# Patient Record
Sex: Female | Born: 1950 | Race: White | Hispanic: No | Marital: Married | Smoking: Never smoker
Health system: Southern US, Community
[De-identification: ages and names within clinical notes are randomized; demographics above are authoritative.]

## PROBLEM LIST (undated history)

## (undated) DIAGNOSIS — K219 Gastro-esophageal reflux disease without esophagitis: Secondary | ICD-10-CM

## (undated) DIAGNOSIS — F32A Depression, unspecified: Secondary | ICD-10-CM

## (undated) DIAGNOSIS — F329 Major depressive disorder, single episode, unspecified: Secondary | ICD-10-CM

## (undated) DIAGNOSIS — M1990 Unspecified osteoarthritis, unspecified site: Secondary | ICD-10-CM

## (undated) DIAGNOSIS — I10 Essential (primary) hypertension: Secondary | ICD-10-CM

## (undated) HISTORY — PX: JOINT REPLACEMENT: SHX530

---

## 2018-06-04 ENCOUNTER — Emergency Department (HOSPITAL_COMMUNITY): Payer: Medicare HMO

## 2018-06-04 ENCOUNTER — Encounter (HOSPITAL_COMMUNITY): Payer: Self-pay

## 2018-06-04 ENCOUNTER — Other Ambulatory Visit: Payer: Self-pay

## 2018-06-04 ENCOUNTER — Observation Stay (HOSPITAL_COMMUNITY)
Admission: EM | Admit: 2018-06-04 | Discharge: 2018-06-05 | Disposition: A | Discharge status: Home / Self Care | Payer: Medicare HMO | Attending: Internal Medicine | Admitting: Internal Medicine

## 2018-06-04 DIAGNOSIS — R1084 Generalized abdominal pain: Secondary | ICD-10-CM

## 2018-06-04 DIAGNOSIS — M1990 Unspecified osteoarthritis, unspecified site: Secondary | ICD-10-CM | POA: Insufficient documentation

## 2018-06-04 DIAGNOSIS — R55 Syncope and collapse: Secondary | ICD-10-CM

## 2018-06-04 DIAGNOSIS — K5900 Constipation, unspecified: Secondary | ICD-10-CM | POA: Insufficient documentation

## 2018-06-04 DIAGNOSIS — F329 Major depressive disorder, single episode, unspecified: Secondary | ICD-10-CM | POA: Diagnosis not present

## 2018-06-04 DIAGNOSIS — Z888 Allergy status to other drugs, medicaments and biological substances status: Secondary | ICD-10-CM | POA: Diagnosis not present

## 2018-06-04 DIAGNOSIS — Z79899 Other long term (current) drug therapy: Secondary | ICD-10-CM | POA: Insufficient documentation

## 2018-06-04 DIAGNOSIS — K449 Diaphragmatic hernia without obstruction or gangrene: Secondary | ICD-10-CM | POA: Insufficient documentation

## 2018-06-04 DIAGNOSIS — Z91048 Other nonmedicinal substance allergy status: Secondary | ICD-10-CM | POA: Insufficient documentation

## 2018-06-04 DIAGNOSIS — Z9660 Presence of unspecified orthopedic joint implant: Secondary | ICD-10-CM | POA: Diagnosis not present

## 2018-06-04 DIAGNOSIS — I700 Atherosclerosis of aorta: Secondary | ICD-10-CM | POA: Insufficient documentation

## 2018-06-04 DIAGNOSIS — K529 Noninfective gastroenteritis and colitis, unspecified: Principal | ICD-10-CM | POA: Insufficient documentation

## 2018-06-04 DIAGNOSIS — R160 Hepatomegaly, not elsewhere classified: Secondary | ICD-10-CM | POA: Diagnosis present

## 2018-06-04 DIAGNOSIS — R1110 Vomiting, unspecified: Secondary | ICD-10-CM | POA: Diagnosis present

## 2018-06-04 DIAGNOSIS — N281 Cyst of kidney, acquired: Secondary | ICD-10-CM | POA: Insufficient documentation

## 2018-06-04 DIAGNOSIS — I10 Essential (primary) hypertension: Secondary | ICD-10-CM | POA: Diagnosis present

## 2018-06-04 DIAGNOSIS — A09 Infectious gastroenteritis and colitis, unspecified: Secondary | ICD-10-CM | POA: Diagnosis not present

## 2018-06-04 DIAGNOSIS — K219 Gastro-esophageal reflux disease without esophagitis: Secondary | ICD-10-CM | POA: Insufficient documentation

## 2018-06-04 DIAGNOSIS — I2510 Atherosclerotic heart disease of native coronary artery without angina pectoris: Secondary | ICD-10-CM | POA: Diagnosis not present

## 2018-06-04 DIAGNOSIS — R197 Diarrhea, unspecified: Secondary | ICD-10-CM | POA: Diagnosis present

## 2018-06-04 DIAGNOSIS — Z881 Allergy status to other antibiotic agents status: Secondary | ICD-10-CM | POA: Diagnosis not present

## 2018-06-04 HISTORY — DX: Unspecified osteoarthritis, unspecified site: M19.90

## 2018-06-04 HISTORY — DX: Essential (primary) hypertension: I10

## 2018-06-04 HISTORY — DX: Gastro-esophageal reflux disease without esophagitis: K21.9

## 2018-06-04 HISTORY — DX: Major depressive disorder, single episode, unspecified: F32.9

## 2018-06-04 HISTORY — DX: Depression, unspecified: F32.A

## 2018-06-04 LAB — CBC WITH DIFFERENTIAL/PLATELET
Abs Immature Granulocytes: 0.02 10*3/uL (ref 0.00–0.07)
BASOS ABS: 0 10*3/uL (ref 0.0–0.1)
BASOS PCT: 0 %
EOS ABS: 0.2 10*3/uL (ref 0.0–0.5)
Eosinophils Relative: 2 %
HEMATOCRIT: 40.4 % (ref 36.0–46.0)
Hemoglobin: 12.8 g/dL (ref 12.0–15.0)
IMMATURE GRANULOCYTES: 0 %
Lymphocytes Relative: 17 %
Lymphs Abs: 1.1 10*3/uL (ref 0.7–4.0)
MCH: 26.9 pg (ref 26.0–34.0)
MCHC: 31.7 g/dL (ref 30.0–36.0)
MCV: 85.1 fL (ref 80.0–100.0)
Monocytes Absolute: 0.1 10*3/uL (ref 0.1–1.0)
Monocytes Relative: 1 %
NEUTROS PCT: 80 %
NRBC: 0 % (ref 0.0–0.2)
Neutro Abs: 5.3 10*3/uL (ref 1.7–7.7)
Platelets: 242 10*3/uL (ref 150–400)
RBC: 4.75 MIL/uL (ref 3.87–5.11)
RDW: 13.7 % (ref 11.5–15.5)
WBC: 6.7 10*3/uL (ref 4.0–10.5)

## 2018-06-04 LAB — URINALYSIS, ROUTINE W REFLEX MICROSCOPIC
BACTERIA UA: NONE SEEN
Bilirubin Urine: NEGATIVE
Glucose, UA: NEGATIVE mg/dL
Ketones, ur: NEGATIVE mg/dL
Leukocytes, UA: NEGATIVE
Nitrite: NEGATIVE
PROTEIN: NEGATIVE mg/dL
Specific Gravity, Urine: 1.016 (ref 1.005–1.030)
pH: 7 (ref 5.0–8.0)

## 2018-06-04 LAB — COMPREHENSIVE METABOLIC PANEL
ALBUMIN: 4.3 g/dL (ref 3.5–5.0)
ALT: 29 U/L (ref 0–44)
AST: 31 U/L (ref 15–41)
Alkaline Phosphatase: 77 U/L (ref 38–126)
Anion gap: 10 (ref 5–15)
BILIRUBIN TOTAL: 0.7 mg/dL (ref 0.3–1.2)
BUN: 12 mg/dL (ref 8–23)
CO2: 23 mmol/L (ref 22–32)
Calcium: 9.5 mg/dL (ref 8.9–10.3)
Chloride: 105 mmol/L (ref 98–111)
Creatinine, Ser: 1.01 mg/dL — ABNORMAL HIGH (ref 0.44–1.00)
GFR calc Af Amer: >60 mL/min (ref >60)
GFR calc non Af Amer: 56 mL/min — ABNORMAL LOW (ref >60)
GLUCOSE: 111 mg/dL — AB (ref 70–99)
POTASSIUM: 3.6 mmol/L (ref 3.5–5.1)
SODIUM: 138 mmol/L (ref 135–145)
Total Protein: 7.9 g/dL (ref 6.5–8.1)

## 2018-06-04 LAB — LIPASE, BLOOD: Lipase: 73 U/L — ABNORMAL HIGH (ref 11–51)

## 2018-06-04 LAB — MRSA PCR SCREENING: MRSA BY PCR: NEGATIVE

## 2018-06-04 LAB — TROPONIN I

## 2018-06-04 MED ORDER — CIPROFLOXACIN HCL 500 MG PO TABS: 500.0000 mg | ORAL_TABLET | Freq: Two times a day (BID) | ORAL | 0 refills | Status: DC

## 2018-06-04 MED ORDER — PANTOPRAZOLE SODIUM 40 MG PO TBEC
40.0000 mg | DELAYED_RELEASE_TABLET | Freq: Every day | ORAL | Status: DC
Start: 2018-06-04 — End: 2018-06-05
  Administered 2018-06-04 – 2018-06-05 (×2): 40 mg via ORAL
  Filled 2018-06-04 (×2): qty 1

## 2018-06-04 MED ORDER — ONDANSETRON 8 MG PO TBDP: 8.0000 mg | ORAL_TABLET | Freq: Three times a day (TID) | ORAL | 0 refills | Status: DC | PRN

## 2018-06-04 MED ORDER — CIPROFLOXACIN IN D5W 400 MG/200ML IV SOLN
400.0000 mg | Freq: Two times a day (BID) | INTRAVENOUS | Status: DC
  Administered 2018-06-04 – 2018-06-05 (×3): 400 mg via INTRAVENOUS
  Filled 2018-06-04 (×3): qty 200

## 2018-06-04 MED ORDER — LOSARTAN POTASSIUM 50 MG PO TABS
100.0000 mg | ORAL_TABLET | Freq: Every day | ORAL | Status: DC
  Administered 2018-06-04 – 2018-06-05 (×2): 100 mg via ORAL
  Filled 2018-06-04 (×2): qty 2

## 2018-06-04 MED ORDER — HYOSCYAMINE SULFATE 0.125 MG PO TABS
0.2500 mg | ORAL_TABLET | Freq: Once | ORAL | Status: AC
  Administered 2018-06-04: 0.25 mg via ORAL
  Filled 2018-06-04: qty 2

## 2018-06-04 MED ORDER — ONDANSETRON HCL 4 MG PO TABS
4.0000 mg | ORAL_TABLET | Freq: Four times a day (QID) | ORAL | Status: DC | PRN
  Administered 2018-06-05: 4 mg via ORAL
  Filled 2018-06-04: qty 1

## 2018-06-04 MED ORDER — SIMETHICONE 80 MG PO CHEW
80.0000 mg | CHEWABLE_TABLET | Freq: Once | ORAL | Status: AC
  Administered 2018-06-04: 80 mg via ORAL
  Filled 2018-06-04: qty 1

## 2018-06-04 MED ORDER — ACETAMINOPHEN 650 MG RE SUPP: 650.0000 mg | Freq: Four times a day (QID) | RECTAL | Status: DC | PRN

## 2018-06-04 MED ORDER — IOPAMIDOL (ISOVUE-300) INJECTION 61%
100.0000 mL | Freq: Once | INTRAVENOUS | Status: AC | PRN
  Administered 2018-06-04: 100 mL via INTRAVENOUS

## 2018-06-04 MED ORDER — ONDANSETRON HCL 4 MG/2ML IJ SOLN: 4.0000 mg | Freq: Four times a day (QID) | INTRAMUSCULAR | Status: DC | PRN

## 2018-06-04 MED ORDER — ONDANSETRON HCL 4 MG/2ML IJ SOLN
INTRAMUSCULAR | Status: AC
  Filled 2018-06-04: qty 2

## 2018-06-04 MED ORDER — ONDANSETRON HCL 4 MG/2ML IJ SOLN
4.0000 mg | Freq: Once | INTRAMUSCULAR | Status: AC
  Administered 2018-06-04: 4 mg via INTRAVENOUS

## 2018-06-04 MED ORDER — METRONIDAZOLE IN NACL 5-0.79 MG/ML-% IV SOLN
500.0000 mg | Freq: Three times a day (TID) | INTRAVENOUS | Status: DC
  Administered 2018-06-04 – 2018-06-05 (×3): 500 mg via INTRAVENOUS
  Filled 2018-06-04 (×3): qty 100

## 2018-06-04 MED ORDER — SODIUM CHLORIDE 0.9 % IV BOLUS
500.0000 mL | Freq: Once | INTRAVENOUS | Status: AC
  Administered 2018-06-04: 500 mL via INTRAVENOUS

## 2018-06-04 MED ORDER — ONDANSETRON HCL 4 MG/2ML IJ SOLN
4.0000 mg | Freq: Once | INTRAMUSCULAR | Status: AC
  Administered 2018-06-04: 4 mg via INTRAVENOUS
  Filled 2018-06-04: qty 2

## 2018-06-04 MED ORDER — MORPHINE SULFATE (PF) 4 MG/ML IV SOLN
4.0000 mg | Freq: Once | INTRAVENOUS | Status: AC
  Administered 2018-06-04: 4 mg via INTRAVENOUS
  Filled 2018-06-04: qty 1

## 2018-06-04 MED ORDER — CITALOPRAM HYDROBROMIDE 20 MG PO TABS
40.0000 mg | ORAL_TABLET | Freq: Every day | ORAL | Status: DC
Start: 2018-06-04 — End: 2018-06-05
  Administered 2018-06-04 – 2018-06-05 (×2): 40 mg via ORAL
  Filled 2018-06-04 (×2): qty 2

## 2018-06-04 MED ORDER — POTASSIUM CHLORIDE 2 MEQ/ML IV SOLN
INTRAVENOUS | Status: DC
  Administered 2018-06-04 – 2018-06-05 (×2): via INTRAVENOUS
  Filled 2018-06-04 (×7): qty 1000

## 2018-06-04 MED ORDER — METRONIDAZOLE 500 MG PO TABS: 500.0000 mg | ORAL_TABLET | Freq: Three times a day (TID) | ORAL | 0 refills | Status: DC

## 2018-06-04 MED ORDER — METRONIDAZOLE 500 MG PO TABS
500.0000 mg | ORAL_TABLET | Freq: Once | ORAL | Status: AC
  Administered 2018-06-04: 500 mg via ORAL
  Filled 2018-06-04: qty 1

## 2018-06-04 MED ORDER — ACETAMINOPHEN 325 MG PO TABS: 650.0000 mg | ORAL_TABLET | Freq: Four times a day (QID) | ORAL | Status: DC | PRN

## 2018-06-04 NOTE — Progress Notes (Signed)
Pt refusing second IV access for IV abx to be given. This RN called and spoke with [facility] to see if Cipro and Flagyl is compatible with LR with 20 of Potassium. [facility] stated they are compatible.

## 2018-06-04 NOTE — H&P (Addendum)
History and Physical    Charlene Jensen ONG:[id number] DOB: [birth year] DOA: 06/04/2018  PCP: System, Pcp Not In Patient is visiting from [place]  Patient coming from: home  I have personally briefly reviewed patient's old medical records in [facility]  Chief Complaint: vomiting, diarrhea  HPI: Charlene Jensen is a 67 y.o. female with medical history significant of HTN, GERD who is visiting her sister from [place]. She reports that she felt constipated for the last several days. She had been having small bowel movements, but felt bloated. She had taken two laxative pills and then began having the urge to move her bowels. She sat on the toilet and began straining. She began to feel lightheaded and felt as though she was going to pass out, although she denies loss of consciousness. She felt unwell, so paramedics were called. When they arrived, she started moving her bowels and reports having several bowel movements. She also started vomiting yellow colored vomitus. She denies any fever, cough or shortness of breath. She has noticed a small amount of blood in her stools. She denies any recent antibiotics. No sick contacts. She has not been eating out.  ED Course: in ED, labs were noted to be unremarkable. Heart rate noted in the 50-60s. EKG did not show any acute changes. CT abdomen did show some colitis. Since she had persistent vomiting, she has been referred for admission.  Review of Systems: As per HPI otherwise 10 point review of systems negative.    Past Medical History:  Diagnosis Date  . Acid reflux   . Arthritis   . Depression   . Hypertension     Past Surgical History:  Procedure Laterality Date  . JOINT REPLACEMENT      Social History:  reports that she has never smoked. She has never used smokeless tobacco. Her alcohol and drug histories are not on file.  Allergies  Allergen Reactions  . Adhesive [Tape]   . Zithromax [Azithromycin] Hives    Family  history: Family history reviewed and not pertinent  Prior to Admission medications   Medication Sig Start Date End Date Taking? Authorizing Provider  citalopram (CELEXA) 40 MG tablet Take 40 mg by mouth daily.  05/06/18  Yes [provider]  losartan (COZAAR) 100 MG tablet Take 100 mg by mouth daily.   Yes [provider]  pantoprazole (PROTONIX) 40 MG tablet Take 40 mg by mouth daily.  05/06/18  Yes [provider]  ciprofloxacin (CIPRO) 500 MG tablet Take 1 tablet (500 mg total) by mouth 2 (two) times daily. 06/04/18   Linwood Dibbles, MD  metroNIDAZOLE (FLAGYL) 500 MG tablet Take 1 tablet (500 mg total) by mouth 3 (three) times daily for 10 days. 06/04/18 06/14/18  Linwood Dibbles, MD  ondansetron (ZOFRAN ODT) 8 MG disintegrating tablet Take 1 tablet (8 mg total) by mouth every 8 (eight) hours as needed for nausea or vomiting. 06/04/18   Linwood Dibbles, MD    Physical Exam: Vitals:   06/04/18 1357 06/04/18 1415 06/04/18 1430 06/04/18 1456  BP:    (!) 168/82  Pulse: (!) 59 62 (!) 58 (!) 56  Resp: 19 16 18 14   Temp:      TempSrc:      SpO2: 92% 93% 93% 92%  Weight:      Height:        Constitutional: NAD, calm, comfortable Eyes: PERRL, lids and conjunctivae normal ENMT: Mucous membranes are moist. Posterior pharynx clear of any exudate or lesions.Normal dentition.  Neck: normal, supple, no masses, no thyromegaly Respiratory: clear to auscultation bilaterally, no wheezing, no crackles. Normal respiratory effort. No accessory muscle use.  Cardiovascular: Regular rate and rhythm, no murmurs / rubs / gallops. No extremity edema. 2+ pedal pulses. No carotid bruits.  Abdomen: no tenderness, no masses palpated. No hepatosplenomegaly. Bowel sounds positive.  Musculoskeletal: no clubbing / cyanosis. No joint deformity upper and lower extremities. Good ROM, no contractures. Normal muscle tone.  Skin: no rashes, lesions, ulcers. No induration Neurologic: CN 2-12 grossly intact.  Sensation intact, DTR normal. Strength 5/5 in all 4.  Psychiatric: Normal judgment and insight. Alert and oriented x 3. Normal mood.    Labs on Admission: I have personally reviewed following labs and imaging studies  CBC: Recent Labs  Lab 06/04/18 0623  WBC 6.7  NEUTROABS 5.3  HGB 12.8  HCT 40.4  MCV 85.1  PLT 242   Basic Metabolic Panel: Recent Labs  Lab 06/04/18 0623  NA 138  K 3.6  CL 105  CO2 23  GLUCOSE 111*  BUN 12  CREATININE 1.01*  CALCIUM 9.5   GFR: Estimated Creatinine Clearance: 47.4 mL/min (A) (by C-G formula based on SCr of 1.01 mg/dL (H)). Liver Function Tests: Recent Labs  Lab 06/04/18 0623  AST 31  ALT 29  ALKPHOS 77  BILITOT 0.7  PROT 7.9  ALBUMIN 4.3   Recent Labs  Lab 06/04/18 0623  LIPASE 73*   No results for input(s): AMMONIA in the last 168 hours. Coagulation Profile: No results for input(s): INR, PROTIME in the last 168 hours. Cardiac Enzymes: Recent Labs  Lab 06/04/18 0623  TROPONINI <0.03   BNP (last 3 results) No results for input(s): PROBNP in the last 8760 hours. HbA1C: No results for input(s): HGBA1C in the last 72 hours. CBG: No results for input(s): GLUCAP in the last 168 hours. Lipid Profile: No results for input(s): CHOL, HDL, LDLCALC, TRIG, CHOLHDL, LDLDIRECT in the last 72 hours. Thyroid Function Tests: No results for input(s): TSH, T4TOTAL, FREET4, T3FREE, THYROIDAB in the last 72 hours. Anemia Panel: No results for input(s): VITAMINB12, FOLATE, FERRITIN, TIBC, IRON, RETICCTPCT in the last 72 hours. Urine analysis:    Component Value Date/Time   COLORURINE STRAW (A) 06/04/2018 0550   APPEARANCEUR CLEAR 06/04/2018 0550   LABSPEC 1.016 06/04/2018 0550   PHURINE 7.0 06/04/2018 0550   GLUCOSEU NEGATIVE 06/04/2018 0550   HGBUR SMALL (A) 06/04/2018 0550   BILIRUBINUR NEGATIVE 06/04/2018 0550   KETONESUR NEGATIVE 06/04/2018 0550   PROTEINUR NEGATIVE 06/04/2018 0550   NITRITE NEGATIVE 06/04/2018 0550    LEUKOCYTESUR NEGATIVE 06/04/2018 0550    Radiological Exams on Admission: Ct Abdomen Pelvis W Contrast  Result Date: 06/04/2018 CLINICAL DATA:  Constipation. Abdominal distension. Umbilical and bilateral lower abdominal pain. EXAM: CT ABDOMEN AND PELVIS WITH CONTRAST TECHNIQUE: Multidetector CT imaging of the abdomen and pelvis was performed using the standard protocol following bolus administration of intravenous contrast. CONTRAST:  ISOVUE-300 IOPAMIDOL (ISOVUE-300) INJECTION 61% COMPARISON:  Abdominal radiographs from earlier today. FINDINGS: Lower chest: No significant pulmonary nodules or acute consolidative airspace disease. Coronary atherosclerosis. Hepatobiliary: Normal liver size. There is a peripheral segment 8 right liver lobe 3.7 cm mass (series 2/image 22) with progressive discontinuous peripheral nodular enhancement, compatible with a hemangioma. There is a similar 1.2 cm posterior right liver lobe mass (series 2/image 27) with progressive nodular enhancement compatible with a hemangioma. There is a segment 8 right liver dome hypodense 3.7 x 2.1 cm mass (series 2/image 12).  No additional liver lesions. Normal gallbladder with no radiopaque cholelithiasis. No biliary ductal dilatation. Pancreas: Normal, with no mass or duct dilation. Spleen: Normal size. No mass. Adrenals/Urinary Tract: Normal adrenals. Simple small renal cysts in the upper kidneys bilaterally measuring 1.2 cm on the right and 1.2 cm on the left. Subcentimeter hypodense renal cortical lesion in the posterior interpolar left kidney, too small to characterize, which requires no follow-up. No hydronephrosis. Normal bladder. Stomach/Bowel: Small hiatal hernia. Otherwise normal nondistended stomach. Normal caliber small bowel with no small bowel wall thickening. Normal appendix. Fluid levels in right colon. Borderline mild wall thickening and mucosal hyperenhancement throughout the right and transverse colon with trace  pericolonic fat stranding. No significant colonic diverticulosis. Vascular/Lymphatic: Atherosclerotic nonaneurysmal abdominal aorta. Patent portal, splenic, hepatic and renal veins. No pathologically enlarged lymph nodes in the abdomen or pelvis. Reproductive: Status post hysterectomy, with no abnormal findings at the vaginal cuff. No adnexal mass. Other: No pneumoperitoneum, ascites or focal fluid collection. Musculoskeletal: No aggressive appearing focal osseous lesions. Mild lower thoracic spondylosis. IMPRESSION: 1. CT findings are suggestive of a mild nonspecific infectious or inflammatory proximal colitis. No bowel obstruction. No free air. No abscess. Normal appendix. 2. Indeterminate hypodense 3.7 cm right liver dome mass. Outpatient MRI abdomen without with IV contrast recommended for further characterization. 3. Small hiatal hernia. 4.  Aortic Atherosclerosis (ICD10-I70.0). Electronically Signed   By: Delbert Phenix M.D.   On: 06/04/2018 08:15   Dg Abd Acute W/chest  Result Date: 06/04/2018 CLINICAL DATA:  Constipation. Syncope during attempted bowel movement. Abdominal pain. EXAM: DG ABDOMEN ACUTE W/ 1V CHEST COMPARISON:  None. FINDINGS: Normal heart size and pulmonary vascularity. No focal airspace disease or consolidation in the lungs. No blunting of costophrenic angles. No pneumothorax. Mediastinal contours appear intact. Scattered gas and stool in the colon. No small or large bowel distention. No free intra-abdominal air. No abnormal air-fluid levels. No radiopaque stones. Visualized bones appear intact. IMPRESSION: No evidence of active pulmonary disease. Normal nonobstructive bowel gas pattern. Electronically Signed   By: Burman Nieves M.D.   On: 06/04/2018 06:22    EKG: Independently reviewed. Sinus rhythm without acute changes  Assessment/Plan Principal Problem:   Colitis Active Problems:   Vomiting   Diarrhea   HTN (hypertension)   GERD (gastroesophageal reflux disease)      1. Acute colitis. Presumed infectious. Will treat supportively. Use antiemetics as needed. Continue on cipro/flagyl. Clear liquid diet and advance as tolerated. 2. Vomiting. Continue anti emetics 3. HTN. Continue on losartan. Blood pressure is elevated 4. GERD. Continue on PPI 5. Diarrhea. Continue to monitor frequency. If this continues with more than 4x/day, will consider stool studies. She did have some specks of blood in stools, suspect this is related to straining from constipation. Continue to monitor 6. Liver mass. Incidental finding noted on CT abdomen. Needs outpatient MRI abdomen with contrast  DVT prophylaxis: SCDs  Code Status: full code  Family Communication: no family present  Disposition Plan: discharge home once improved  Consults called:   Admission status: observation, medsurg   Erick Blinks MD [facility] Pager [phone]  If 7PM-7AM, please contact night-coverage [email or web address] Password [email or web address]  06/04/2018, 3:27 PM

## 2018-06-04 NOTE — Discharge Instructions (Signed)
Take the antibiotics as prescribed, follow-up with your doctor later this week to make sure you are improving, return to emergency room if you start having fever or worsening symptoms

## 2018-06-04 NOTE — ED Notes (Signed)
ED Provider at bedside. 

## 2018-06-04 NOTE — ED Notes (Signed)
Stool Occult negative

## 2018-06-04 NOTE — ED Triage Notes (Signed)
Pt reports that she has been constipated, had taken some medication for the constipation, was attempting to have a bowel movement this am and had a syncopal episode, upon pt arrival to er, pt alert, able to answer questions, c/o abd pain,

## 2018-06-04 NOTE — Progress Notes (Signed)
When pt up to [facility] to urinate- RN noticed bright red blood in toilet what looked like a clot. Pt stated she had passed gas, but had no BM.  Will continue to monitor

## 2018-06-04 NOTE — ED Provider Notes (Addendum)
I reviewed the findings with the patient.  CT scan suggests mild colitis.  Likely infectious in nature.  Incidental liver mass also noted.  Patient states she is aware of this diagnosis and has a known hemangioma.  Will dc home with abx   Linwood Dibbles, MD 06/04/18 0935  Pt started vomiting after treatment.  She does not feel well enough to go home.  Will consult with medical service.   Linwood Dibbles, MD 06/04/18 [phone]

## 2018-06-04 NOTE — ED Notes (Signed)
Ems gave zofran en route

## 2018-06-04 NOTE — ED Notes (Signed)
Unable to get accurate blood pressure due to pt trembling

## 2018-06-04 NOTE — ED Notes (Addendum)
Adela Lank (sister [phone] or [phone]) is allowed to be updated on pts medial condition and any other questions needed.

## 2018-06-04 NOTE — ED Provider Notes (Signed)
[facility] EMERGENCY DEPARTMENT Provider Note   CSN: [id number] Arrival date & time: 06/04/18  0543     History   Chief Complaint Chief Complaint  Patient presents with  . Near Syncope  . Constipation    HPI Charlene Jensen is a 67 y.o. female.  Patient presents to the ER for evaluation of near syncope.  Patient reports that she recently was on vacation, changed her eating habits and has been constipated.  She has not had a normal bowel movement in several days.  Her sister gave her some Dulcolax tablets.  She took multiple tablets but initially did not have any response.  She woke up this morning and felt severe abdominal cramping, thought she needed to have a bowel movement.  She sat on the toilet and strained to have a bowel movement at which time she became weak, dizzy and felt like she nearly passed out.  She did not fall or injure herself.  EMS arrived at the scene and found her to be bradycardic and hypotensive, this has improved.  She is actually now hypertensive.  Patient still reports that she is shaky.     Past Medical History:  Diagnosis Date  . Acid reflux   . Arthritis   . Depression   . Hypertension     There are no active problems to display for this patient.   Past Surgical History:  Procedure Laterality Date  . JOINT REPLACEMENT       OB History   None      Home Medications    Prior to Admission medications   Medication Sig Start Date End Date Taking? Authorizing Provider  losartan (COZAAR) 100 MG tablet Take 100 mg by mouth daily.   Yes [provider]  pantoprazole (PROTONIX) 20 MG tablet Take 20 mg by mouth daily.   Yes [provider]    Family History History reviewed. No pertinent family history.  Social History Social History   Tobacco Use  . Smoking status: Never Smoker  . Smokeless tobacco: Never Used  Substance Use Topics  . Alcohol use: Not on file  . Drug use: Not on file     Allergies   Adhesive  [tape] and Zithromax [azithromycin]   Review of Systems Review of Systems  Gastrointestinal: Positive for constipation.  Neurological: Positive for dizziness.  All other systems reviewed and are negative.    Physical Exam Updated Vital Signs BP (!) 132/110   Pulse 80   Temp 98 F (36.7 C) (Oral)   Resp (!) 23   Ht 4\' 10"  (1.473 m)   Wt 79.4 kg   SpO2 95%   BMI 36.58 kg/m   Physical Exam  Constitutional: She is oriented to person, place, and time. She appears well-developed and well-nourished. No distress.  HENT:  Head: Normocephalic and atraumatic.  Right Ear: Hearing normal.  Left Ear: Hearing normal.  Nose: Nose normal.  Mouth/Throat: Oropharynx is clear and moist and mucous membranes are normal.  Eyes: Pupils are equal, round, and reactive to light. Conjunctivae and EOM are normal.  Neck: Normal range of motion. Neck supple.  Cardiovascular: Regular rhythm, S1 normal and S2 normal. Exam reveals no gallop and no friction rub.  No murmur heard. Pulmonary/Chest: Effort normal and breath sounds normal. No respiratory distress. She exhibits no tenderness.  Abdominal: Soft. Normal appearance and bowel sounds are normal. There is no hepatosplenomegaly. There is no tenderness. There is no rebound, no guarding, no tenderness at McBurney's point and  negative Murphy's sign. No hernia.  Musculoskeletal: Normal range of motion.  Neurological: She is alert and oriented to person, place, and time. She has normal strength. No cranial nerve deficit or sensory deficit. Coordination normal. GCS eye subscore is 4. GCS verbal subscore is 5. GCS motor subscore is 6.  Skin: Skin is warm, dry and intact. No rash noted. No cyanosis.  Psychiatric: She has a normal mood and affect. Her speech is normal and behavior is normal. Thought content normal.  Nursing note and vitals reviewed.    ED Treatments / Results  Labs (all labs ordered are listed, but only abnormal results are displayed) Labs  Reviewed  COMPREHENSIVE METABOLIC PANEL - Abnormal; Notable for the following components:      Result Value   Glucose, Bld 111 (*)    Creatinine, Ser 1.01 (*)    GFR calc non Af Amer 56 (*)    All other components within normal limits  LIPASE, BLOOD - Abnormal; Notable for the following components:   Lipase 73 (*)    All other components within normal limits  CBC WITH DIFFERENTIAL/PLATELET  TROPONIN I  URINALYSIS, ROUTINE W REFLEX MICROSCOPIC    EKG EKG Interpretation  Date/Time:  Monday June 04 2018 05:53:51 EST Ventricular Rate:  60 PR Interval:    QRS Duration: 95 QT Interval:  456 QTC Calculation: 456 R Axis:   4 Text Interpretation:  Sinus rhythm Normal ECG Confirmed by Gilda Crease ([id number]) on 06/04/2018 5:57:33 AM   Radiology Dg Abd Acute W/chest  Result Date: 06/04/2018 CLINICAL DATA:  Constipation. Syncope during attempted bowel movement. Abdominal pain. EXAM: DG ABDOMEN ACUTE W/ 1V CHEST COMPARISON:  None. FINDINGS: Normal heart size and pulmonary vascularity. No focal airspace disease or consolidation in the lungs. No blunting of costophrenic angles. No pneumothorax. Mediastinal contours appear intact. Scattered gas and stool in the colon. No small or large bowel distention. No free intra-abdominal air. No abnormal air-fluid levels. No radiopaque stones. Visualized bones appear intact. IMPRESSION: No evidence of active pulmonary disease. Normal nonobstructive bowel gas pattern. Electronically Signed   By: Burman Nieves M.D.   On: 06/04/2018 06:22    Procedures Procedures (including critical care time)  Medications Ordered in ED Medications  hyoscyamine (LEVSIN, ANASPAZ) tablet 0.25 mg (has no administration in time range)  sodium chloride 0.9 % bolus 500 mL (has no administration in time range)  morphine 4 MG/ML injection 4 mg (has no administration in time range)  ondansetron (ZOFRAN) injection 4 mg (has no administration in time range)      Initial Impression / Assessment and Plan / ED Course  I have reviewed the triage vital signs and the nursing notes.  Pertinent labs & imaging results that were available during my care of the patient were reviewed by me and considered in my medical decision making (see chart for details).     She presents to the emergency department after near syncopal episode that occurred while bearing down on the toilet to have a bowel movement.  Patient reports that she has been constipated for the last several days.  She took multiple laxatives but did not have any bowel movement.  This morning she started having abdominal pain and cramping, was bearing down on the toilet when she nearly passed out.  This is likely a vasovagal response secondary to the Valsalva maneuver and abdominal pain.   Abdominal exam reveals diffuse tenderness, no signs of peritonitis.  White blood cell count is normal.  Patient  is afebrile with normal vital signs currently.  Lipase is mildly elevated at 73, nonspecific finding.  LFTs are normal, remainder of blood work is unremarkable.  X-ray shows no significant stool, nonspecific bowel gas pattern.  Will perform CT scan to further evaluate.  Will sign out to oncoming ER physician to follow-up CT.  Final Clinical Impressions(s) / ED Diagnoses   Final diagnoses:  Near syncope  Generalized abdominal pain    ED Discharge Orders    None       Gilda Crease, MD 06/04/18 [phone]

## 2018-06-04 NOTE — Progress Notes (Signed)
Pt c/o gas pain. New order for Mylicon per Dr. Rana Snare. Given, will continue to monitor pt

## 2018-06-05 DIAGNOSIS — K529 Noninfective gastroenteritis and colitis, unspecified: Secondary | ICD-10-CM | POA: Diagnosis not present

## 2018-06-05 DIAGNOSIS — I10 Essential (primary) hypertension: Secondary | ICD-10-CM | POA: Diagnosis not present

## 2018-06-05 DIAGNOSIS — A09 Infectious gastroenteritis and colitis, unspecified: Secondary | ICD-10-CM | POA: Diagnosis not present

## 2018-06-05 DIAGNOSIS — R1084 Generalized abdominal pain: Secondary | ICD-10-CM | POA: Diagnosis not present

## 2018-06-05 LAB — CBC
HEMATOCRIT: 34.7 % — AB (ref 36.0–46.0)
Hemoglobin: 11.1 g/dL — ABNORMAL LOW (ref 12.0–15.0)
MCH: 27.3 pg (ref 26.0–34.0)
MCHC: 32 g/dL (ref 30.0–36.0)
MCV: 85.3 fL (ref 80.0–100.0)
NRBC: 0 % (ref 0.0–0.2)
PLATELETS: 241 10*3/uL (ref 150–400)
RBC: 4.07 MIL/uL (ref 3.87–5.11)
RDW: 14 % (ref 11.5–15.5)
WBC: 6.8 10*3/uL (ref 4.0–10.5)

## 2018-06-05 LAB — BASIC METABOLIC PANEL
Anion gap: 9 (ref 5–15)
BUN: 7 mg/dL — ABNORMAL LOW (ref 8–23)
CHLORIDE: 106 mmol/L (ref 98–111)
CO2: 24 mmol/L (ref 22–32)
CREATININE: 0.86 mg/dL (ref 0.44–1.00)
Calcium: 8.9 mg/dL (ref 8.9–10.3)
Glucose, Bld: 112 mg/dL — ABNORMAL HIGH (ref 70–99)
Potassium: 3.8 mmol/L (ref 3.5–5.1)
SODIUM: 139 mmol/L (ref 135–145)

## 2018-06-05 LAB — HIV ANTIBODY (ROUTINE TESTING W REFLEX): HIV SCREEN 4TH GENERATION: NONREACTIVE

## 2018-06-05 MED ORDER — METRONIDAZOLE 500 MG PO TABS: 500.0000 mg | ORAL_TABLET | Freq: Three times a day (TID) | ORAL | 0 refills | Status: AC

## 2018-06-05 MED ORDER — CIPROFLOXACIN HCL 500 MG PO TABS: 500.0000 mg | ORAL_TABLET | Freq: Two times a day (BID) | ORAL | 0 refills | Status: AC

## 2018-06-05 MED ORDER — ONDANSETRON 8 MG PO TBDP: 8.0000 mg | ORAL_TABLET | Freq: Three times a day (TID) | ORAL | 0 refills | Status: AC | PRN

## 2018-06-05 NOTE — Progress Notes (Signed)
Has had no nausea or vomiting this morning. Walked around ICU about 200 feet and had no difficulties or fluctuation in vitals. Stated that she would really like to go home if possible.

## 2018-06-05 NOTE — Progress Notes (Signed)
Given discharge paperwork and is waiting for family to give her a ride home. No request at this time.

## 2018-06-05 NOTE — Discharge Summary (Addendum)
Physician Discharge Summary  Charlene Jensen ZOX:[id number] DOB: 13-Nov-1950 DOA: 06/04/2018  PCP: Patient is visiting from [place]  Admit date: 06/04/2018 Discharge date: 06/05/2018  Admitted From: Home Disposition: Home  Recommendations for Outpatient Follow-up:  1. Follow up with PCP in 1-2 weeks 2. Please obtain BMP/CBC in one week 3. Consider outpatient MRI abdomen to further evaluate liver mass.  Discharge Condition: Stable CODE STATUS: Full code Diet recommendation: Heart healthy  Brief/Interim Summary: 67 year old female with a history of hypertension and GERD, is visiting her sister from [place].  She reported that she felt constipated for several days prior to admission.  She had been having small bowel movements, but was feeling bloated.  She decided to take 2 laxative pills, which led to her her feeling the urge to move her bowels.  When she sat on the toilet, she began to strain.  She subsequently began feeling lightheaded and felt as though she was going to pass out, although she denies loss of consciousness.  She felt generally unwell and paramedics were called.  When they arrived, she started to have frequent bowel movements.  She also began vomiting, yellow-colored vomitus.  She was evaluated in the emergency room where EKG noted that her heart rate was 50-60.  She underwent CT abdomen that did show some colitis.  Initially, plans were to discharge patient home with oral antibiotics for colitis, but with her persistent vomiting, admission was requested.  During her hospital stay, she did pass a small amount of blood clot per rectum, but has not had any further frank bleeding.  This appears to be self-limited, and likely secondary to previous constipation versus inflammation from colitis.  Currently, appears to have resolved.  She was treated with intravenous antibiotics and antiemetics as well as IV fluids.  Since admission, she is feeling substantially better.  She has not  had any further vomiting and is not tolerating a solid diet.  She has not had persistent diarrhea.  Abdominal pain is significantly improved.  Labs are otherwise unremarkable.  The patient is anxious to discharge home.  She will continue course of oral antibiotics.  She will also use antiemetics as necessary.  Shared plans to return home to [place] today and follow-up with her primary care physician.  There was an incidental finding of a liver mass on CT imaging.  Patient says that she is aware of this and this is been worked up before and it was found to be hemangioma.  She may benefit from outpatient MRI abdomen which can be ordered by her primary care physician.  Discharge Diagnoses:  Principal Problem:   Colitis Active Problems:   Vomiting   Diarrhea   HTN (hypertension)   GERD (gastroesophageal reflux disease)    Discharge Instructions  Discharge Instructions    Diet - low sodium heart healthy   Complete by:  As directed    Increase activity slowly   Complete by:  As directed      Allergies as of 06/05/2018      Reactions   Adhesive [tape]    Zithromax [azithromycin] Hives      Medication List    TAKE these medications   ciprofloxacin 500 MG tablet Commonly known as:  CIPRO Take 1 tablet (500 mg total) by mouth 2 (two) times daily.   citalopram 40 MG tablet Commonly known as:  CELEXA Take 40 mg by mouth daily.   losartan 100 MG tablet Commonly known as:  COZAAR Take 100 mg by mouth daily.  metroNIDAZOLE 500 MG tablet Commonly known as:  FLAGYL Take 1 tablet (500 mg total) by mouth 3 (three) times daily for 10 days.   ondansetron 8 MG disintegrating tablet Commonly known as:  ZOFRAN-ODT Take 1 tablet (8 mg total) by mouth every 8 (eight) hours as needed for nausea or vomiting.   pantoprazole 40 MG tablet Commonly known as:  PROTONIX Take 40 mg by mouth daily.       Allergies  Allergen Reactions  . Adhesive [Tape]   . Zithromax [Azithromycin]  Hives    Consultations:     Procedures/Studies: Ct Abdomen Pelvis W Contrast  Result Date: 06/04/2018 CLINICAL DATA:  Constipation. Abdominal distension. Umbilical and bilateral lower abdominal pain. EXAM: CT ABDOMEN AND PELVIS WITH CONTRAST TECHNIQUE: Multidetector CT imaging of the abdomen and pelvis was performed using the standard protocol following bolus administration of intravenous contrast. CONTRAST:  ISOVUE-300 IOPAMIDOL (ISOVUE-300) INJECTION 61% COMPARISON:  Abdominal radiographs from earlier today. FINDINGS: Lower chest: No significant pulmonary nodules or acute consolidative airspace disease. Coronary atherosclerosis. Hepatobiliary: Normal liver size. There is a peripheral segment 8 right liver lobe 3.7 cm mass (series 2/image 22) with progressive discontinuous peripheral nodular enhancement, compatible with a hemangioma. There is a similar 1.2 cm posterior right liver lobe mass (series 2/image 27) with progressive nodular enhancement compatible with a hemangioma. There is a segment 8 right liver dome hypodense 3.7 x 2.1 cm mass (series 2/image 12). No additional liver lesions. Normal gallbladder with no radiopaque cholelithiasis. No biliary ductal dilatation. Pancreas: Normal, with no mass or duct dilation. Spleen: Normal size. No mass. Adrenals/Urinary Tract: Normal adrenals. Simple small renal cysts in the upper kidneys bilaterally measuring 1.2 cm on the right and 1.2 cm on the left. Subcentimeter hypodense renal cortical lesion in the posterior interpolar left kidney, too small to characterize, which requires no follow-up. No hydronephrosis. Normal bladder. Stomach/Bowel: Small hiatal hernia. Otherwise normal nondistended stomach. Normal caliber small bowel with no small bowel wall thickening. Normal appendix. Fluid levels in right colon. Borderline mild wall thickening and mucosal hyperenhancement throughout the right and transverse colon with trace pericolonic fat stranding. No  significant colonic diverticulosis. Vascular/Lymphatic: Atherosclerotic nonaneurysmal abdominal aorta. Patent portal, splenic, hepatic and renal veins. No pathologically enlarged lymph nodes in the abdomen or pelvis. Reproductive: Status post hysterectomy, with no abnormal findings at the vaginal cuff. No adnexal mass. Other: No pneumoperitoneum, ascites or focal fluid collection. Musculoskeletal: No aggressive appearing focal osseous lesions. Mild lower thoracic spondylosis. IMPRESSION: 1. CT findings are suggestive of a mild nonspecific infectious or inflammatory proximal colitis. No bowel obstruction. No free air. No abscess. Normal appendix. 2. Indeterminate hypodense 3.7 cm right liver dome mass. Outpatient MRI abdomen without with IV contrast recommended for further characterization. 3. Small hiatal hernia. 4.  Aortic Atherosclerosis (ICD10-I70.0). Electronically Signed   By: Delbert Phenix M.D.   On: 06/04/2018 08:15   Dg Abd Acute W/chest  Result Date: 06/04/2018 CLINICAL DATA:  Constipation. Syncope during attempted bowel movement. Abdominal pain. EXAM: DG ABDOMEN ACUTE W/ 1V CHEST COMPARISON:  None. FINDINGS: Normal heart size and pulmonary vascularity. No focal airspace disease or consolidation in the lungs. No blunting of costophrenic angles. No pneumothorax. Mediastinal contours appear intact. Scattered gas and stool in the colon. No small or large bowel distention. No free intra-abdominal air. No abnormal air-fluid levels. No radiopaque stones. Visualized bones appear intact. IMPRESSION: No evidence of active pulmonary disease. Normal nonobstructive bowel gas pattern. Electronically Signed   By: Burman Nieves  M.D.   On: 06/04/2018 06:22       Subjective: Feels better today.  No further vomiting.  Tolerating solid food.  Abdominal cramping is better.  No significant stool since yesterday.  She she does describe passing some clots with flatus earlier this morning.  No bright red blood per  rectum.  Denies any dizziness or lightheadedness on standing.  Discharge Exam: Vitals:   06/04/18 1456 06/04/18 1600 06/04/18 2151 06/05/18 0513  BP: (!) 168/82 (!) 172/52 (!) 172/60 (!) 156/60  Pulse: (!) 56 (!) 58 66 64  Resp: 14 15 18 18   Temp:  98.1 F (36.7 C) 99 F (37.2 C) 99 F (37.2 C)  TempSrc:   Oral Oral  SpO2: 92% 98% 91% 90%  Weight:  84.8 kg    Height:  4\' 10"  (1.473 m)      General: Pt is alert, awake, not in acute distress Cardiovascular: RRR, S1/S2 +, no rubs, no gallops Respiratory: CTA bilaterally, no wheezing, no rhonchi Abdominal: Soft, NT, ND, bowel sounds + Extremities: no edema, no cyanosis    The results of significant diagnostics from this hospitalization (including imaging, microbiology, ancillary and laboratory) are listed below for reference.     Microbiology: Recent Results (from the past 240 hour(s))  MRSA PCR Screening     Status: None   Collection Time: 06/04/18  4:48 PM  Result Value Ref Range Status   MRSA by PCR NEGATIVE NEGATIVE Final    Comment:        The GeneXpert MRSA Assay (FDA approved for NASAL specimens only), is one component of a comprehensive MRSA colonization surveillance program. It is not intended to diagnose MRSA infection nor to guide or monitor treatment for MRSA infections. Performed at [facility], [address] [place], [place] [address]      Labs: BNP (last 3 results) No results for input(s): BNP in the last 8760 hours. Basic Metabolic Panel: Recent Labs  Lab 06/04/18 0623 06/05/18 0419  NA 138 139  K 3.6 3.8  CL 105 106  CO2 23 24  GLUCOSE 111* 112*  BUN 12 7*  CREATININE 1.01* 0.86  CALCIUM 9.5 8.9   Liver Function Tests: Recent Labs  Lab 06/04/18 0623  AST 31  ALT 29  ALKPHOS 77  BILITOT 0.7  PROT 7.9  ALBUMIN 4.3   Recent Labs  Lab 06/04/18 0623  LIPASE 73*   No results for input(s): AMMONIA in the last 168 hours. CBC: Recent Labs  Lab 06/04/18 0623 06/05/18 0419   WBC 6.7 6.8  NEUTROABS 5.3  --   HGB 12.8 11.1*  HCT 40.4 34.7*  MCV 85.1 85.3  PLT 242 241   Cardiac Enzymes: Recent Labs  Lab 06/04/18 0623  TROPONINI <0.03   BNP: Invalid input(s): POCBNP CBG: No results for input(s): GLUCAP in the last 168 hours. D-Dimer No results for input(s): DDIMER in the last 72 hours. Hgb A1c No results for input(s): HGBA1C in the last 72 hours. Lipid Profile No results for input(s): CHOL, HDL, LDLCALC, TRIG, CHOLHDL, LDLDIRECT in the last 72 hours. Thyroid function studies No results for input(s): TSH, T4TOTAL, T3FREE, THYROIDAB in the last 72 hours.  Invalid input(s): FREET3 Anemia work up No results for input(s): VITAMINB12, FOLATE, FERRITIN, TIBC, IRON, RETICCTPCT in the last 72 hours. Urinalysis    Component Value Date/Time   COLORURINE STRAW (A) 06/04/2018 0550   APPEARANCEUR CLEAR 06/04/2018 0550   LABSPEC 1.016 06/04/2018 0550   PHURINE 7.0 06/04/2018 0550  GLUCOSEU NEGATIVE 06/04/2018 0550   HGBUR SMALL (A) 06/04/2018 0550   BILIRUBINUR NEGATIVE 06/04/2018 0550   KETONESUR NEGATIVE 06/04/2018 0550   PROTEINUR NEGATIVE 06/04/2018 0550   NITRITE NEGATIVE 06/04/2018 0550   LEUKOCYTESUR NEGATIVE 06/04/2018 0550   Sepsis Labs Invalid input(s): PROCALCITONIN,  WBC,  LACTICIDVEN Microbiology Recent Results (from the past 240 hour(s))  MRSA PCR Screening     Status: None   Collection Time: 06/04/18  4:48 PM  Result Value Ref Range Status   MRSA by PCR NEGATIVE NEGATIVE Final    Comment:        The GeneXpert MRSA Assay (FDA approved for NASAL specimens only), is one component of a comprehensive MRSA colonization surveillance program. It is not intended to diagnose MRSA infection nor to guide or monitor treatment for MRSA infections. Performed at [facility], [address], [place], [place] [address]      Time coordinating discharge:  SIGNED:   Erick Blinks, MD  [facility] 06/05/2018, 12:16  PM Pager   If 7PM-7AM, please contact night-coverage [email or web address] Password [email or web address]

## 2018-06-05 NOTE — Care Management Obs Status (Signed)
MEDICARE OBSERVATION STATUS NOTIFICATION   Patient Details  Name: Charlene Jensen MRN: [id number] Date of Birth: 12/27/1950   Medicare Observation Status Notification Given:  Yes    Tytus Strahle, Chrystine Oiler, RN 06/05/2018, 8:21 AM

## 2020-05-22 IMAGING — DX DG ABDOMEN ACUTE W/ 1V CHEST
3 series · 3 of 3 positions shown · non-contrast
Comparison: None.

CLINICAL DATA: Constipation. Syncope during attempted bowel
movement. Abdominal pain.

EXAM:
DG ABDOMEN ACUTE W/ 1V CHEST

[chest pa]
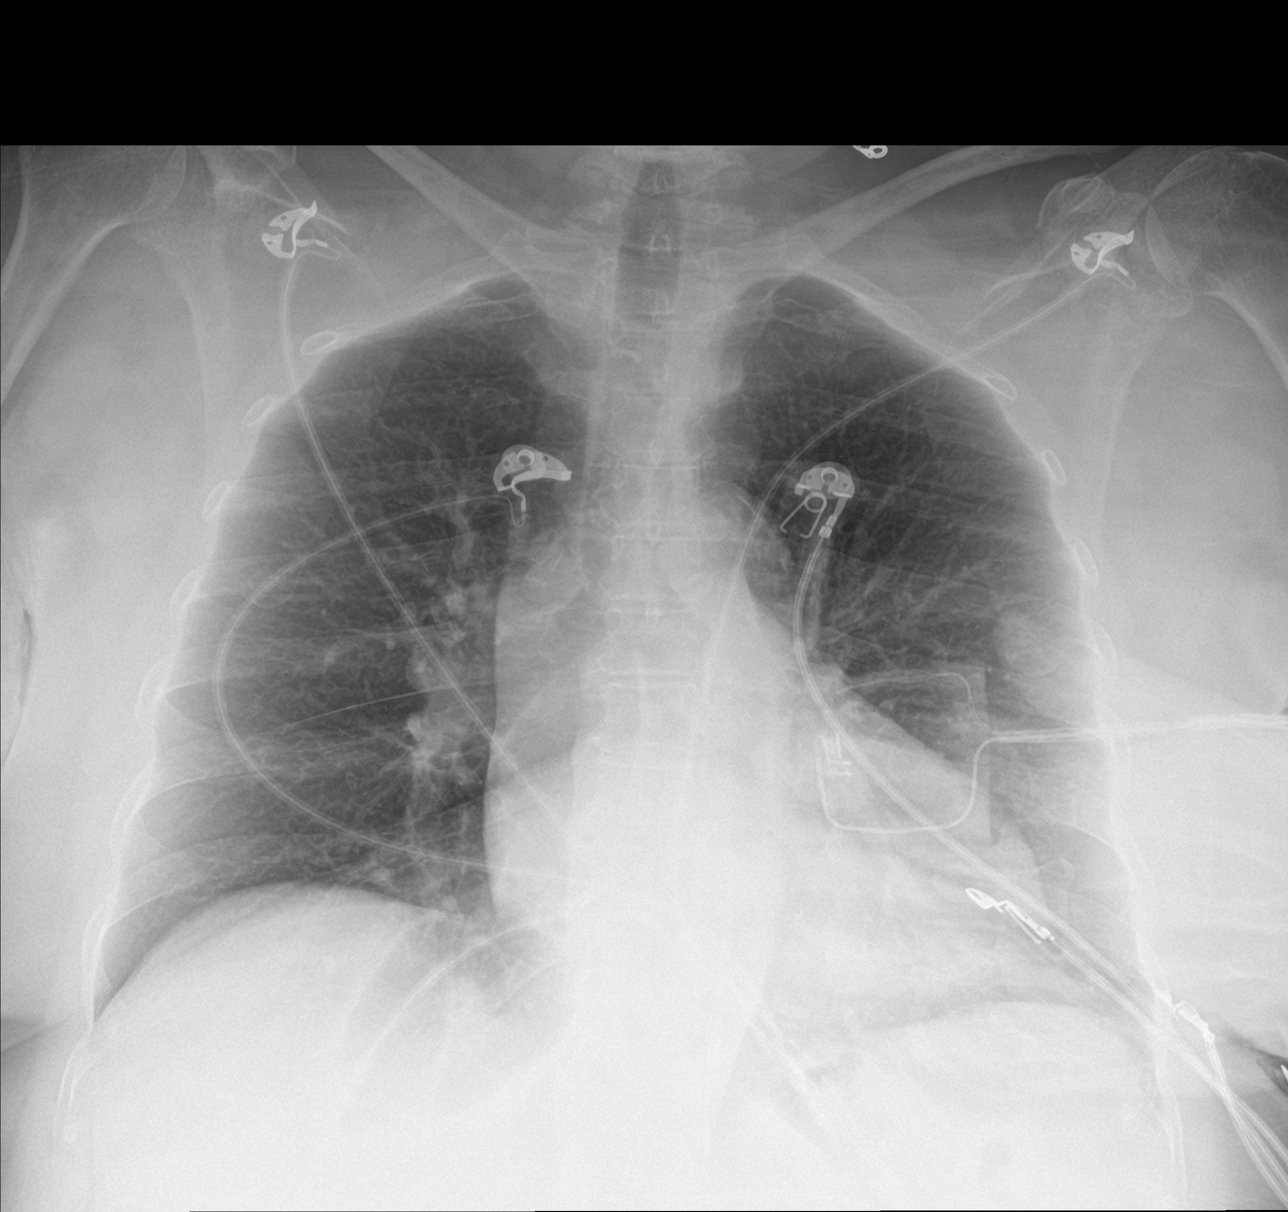

[abdomen erect]
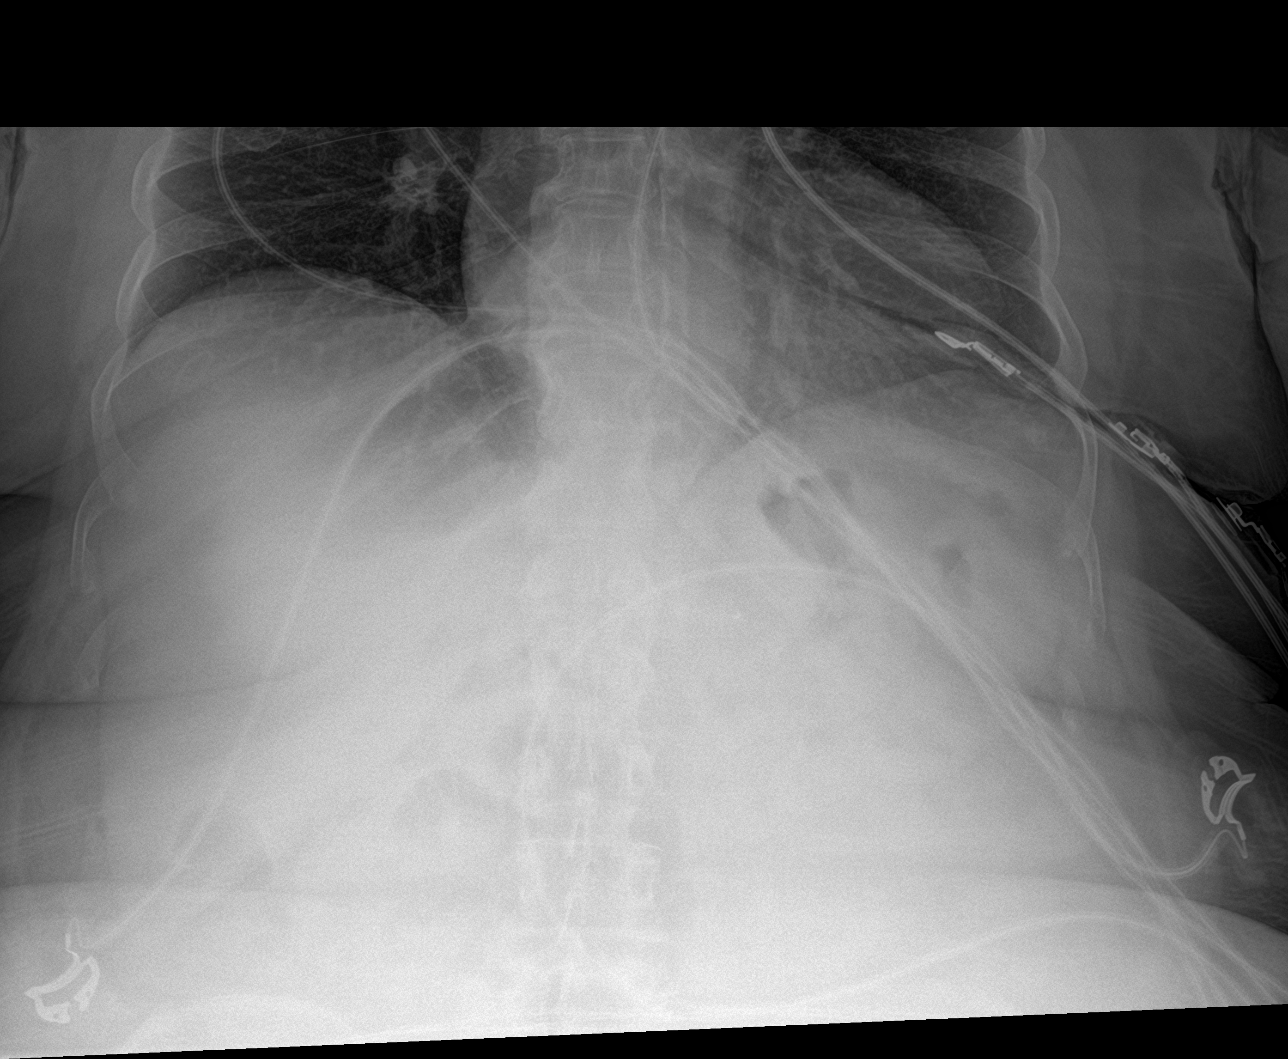

[abdomen supine]
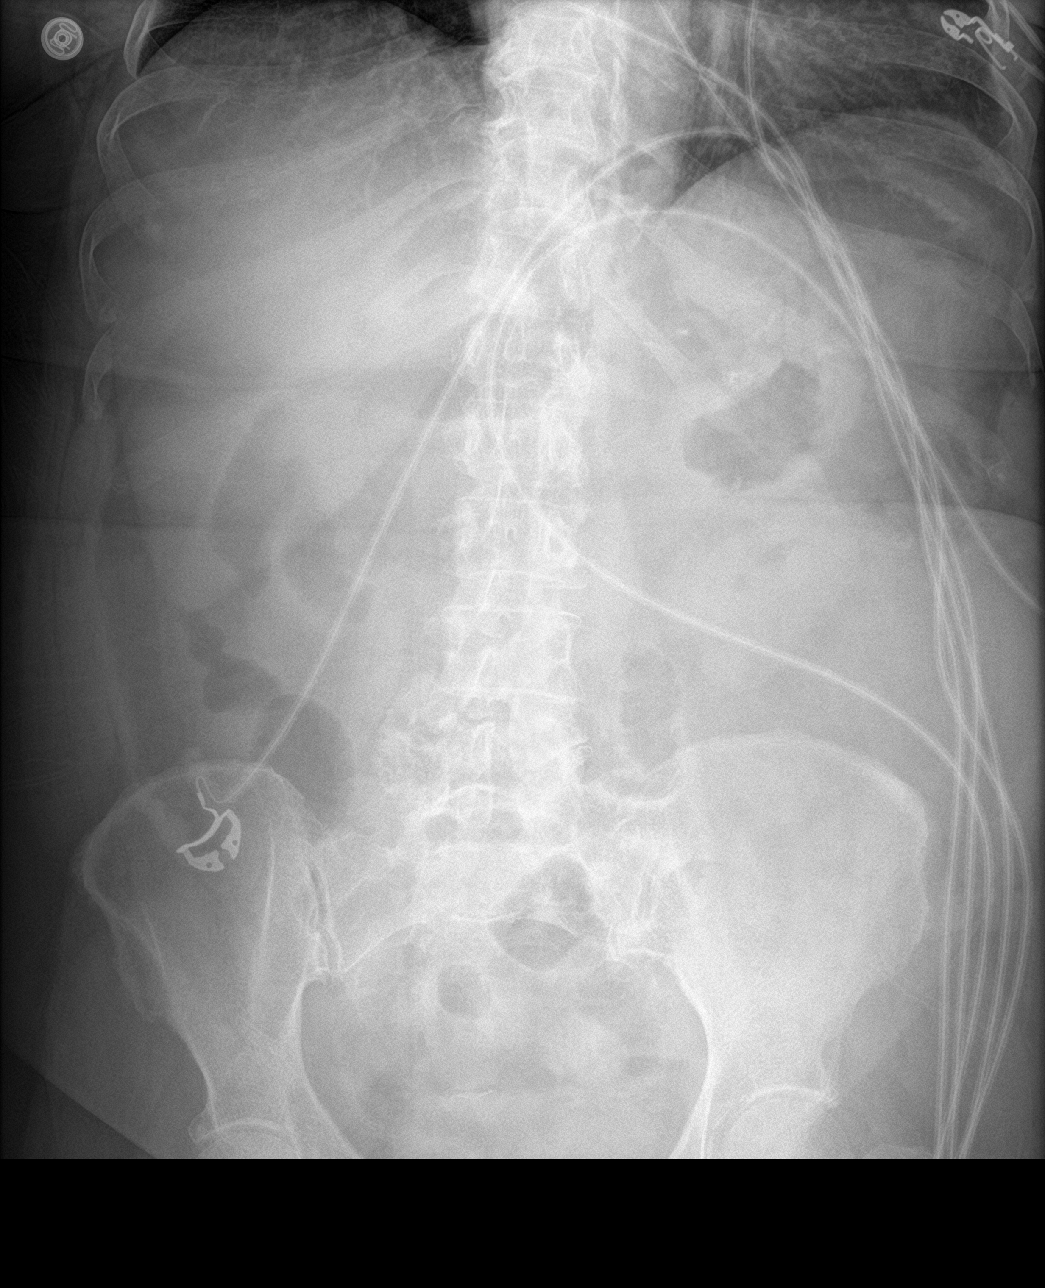

[3 of 3 positions shown; findings below may reference images not displayed]

FINDINGS: Normal heart size and pulmonary vascularity. No focal airspace
disease or consolidation in the lungs. No blunting of costophrenic
angles. No pneumothorax. Mediastinal contours appear intact.

Scattered gas and stool in the colon. No small or large bowel
distention. No free intra-abdominal air. No abnormal air-fluid
levels. No radiopaque stones. Visualized bones appear intact.
IMPRESSION: No evidence of active pulmonary disease. Normal nonobstructive bowel
gas pattern.
